# Patient Record
Sex: Female | Born: 1995 | Race: White | Hispanic: No | Marital: Married | Smoking: Never smoker
Health system: Southern US, Community
[De-identification: ages and names within clinical notes are randomized; demographics above are authoritative.]

## PROBLEM LIST (undated history)

## (undated) DIAGNOSIS — F419 Anxiety disorder, unspecified: Secondary | ICD-10-CM

## (undated) DIAGNOSIS — E119 Type 2 diabetes mellitus without complications: Secondary | ICD-10-CM

---

## 2015-02-17 ENCOUNTER — Emergency Department (HOSPITAL_COMMUNITY): Admission: EM | Admit: 2015-02-17 | Discharge: 2015-02-17 | Discharge status: Absent without leave | Payer: Self-pay

## 2015-02-21 ENCOUNTER — Other Ambulatory Visit (HOSPITAL_COMMUNITY)
Admission: RE | Admit: 2015-02-21 | Discharge: 2015-02-21 | Disposition: A | Discharge status: Home / Self Care | Payer: Medicaid Other | Source: Ambulatory Visit | Attending: Family Medicine | Admitting: Family Medicine

## 2015-02-21 ENCOUNTER — Emergency Department (INDEPENDENT_AMBULATORY_CARE_PROVIDER_SITE_OTHER)
Admission: EM | Admit: 2015-02-21 | Discharge: 2015-02-21 | Disposition: A | Discharge status: Home / Self Care | Payer: Medicaid Other | Source: Home / Self Care

## 2015-02-21 ENCOUNTER — Encounter (HOSPITAL_COMMUNITY): Payer: Self-pay | Admitting: Emergency Medicine

## 2015-02-21 DIAGNOSIS — Z113 Encounter for screening for infections with a predominantly sexual mode of transmission: Secondary | ICD-10-CM | POA: Diagnosis present

## 2015-02-21 DIAGNOSIS — A499 Bacterial infection, unspecified: Secondary | ICD-10-CM

## 2015-02-21 DIAGNOSIS — N760 Acute vaginitis: Secondary | ICD-10-CM | POA: Diagnosis not present

## 2015-02-21 DIAGNOSIS — B9689 Other specified bacterial agents as the cause of diseases classified elsewhere: Secondary | ICD-10-CM

## 2015-02-21 LAB — POCT I-STAT, CHEM 8
BUN: 9 mg/dL (ref 6–20)
CREATININE: 0.6 mg/dL (ref 0.44–1.00)
Calcium, Ion: 1.24 mmol/L — ABNORMAL HIGH (ref 1.12–1.23)
Chloride: 103 mmol/L (ref 101–111)
Glucose, Bld: 98 mg/dL (ref 65–99)
HCT: 44 % (ref 36.0–46.0)
Hemoglobin: 15 g/dL (ref 12.0–15.0)
POTASSIUM: 3.9 mmol/L (ref 3.5–5.1)
Sodium: 140 mmol/L (ref 135–145)
TCO2: 24 mmol/L (ref 0–100)

## 2015-02-21 LAB — POCT URINALYSIS DIP (DEVICE)
Bilirubin Urine: NEGATIVE
Glucose, UA: NEGATIVE mg/dL
Hgb urine dipstick: NEGATIVE
Ketones, ur: NEGATIVE mg/dL
Nitrite: NEGATIVE
PH: 6.5 (ref 5.0–8.0)
PROTEIN: NEGATIVE mg/dL
UROBILINOGEN UA: 0.2 mg/dL (ref 0.0–1.0)

## 2015-02-21 LAB — POCT PREGNANCY, URINE: Preg Test, Ur: NEGATIVE

## 2015-02-21 MED ORDER — METRONIDAZOLE 500 MG PO TABS: 500.0000 mg | ORAL_TABLET | Freq: Two times a day (BID) | ORAL | Status: DC

## 2015-02-21 NOTE — ED Notes (Signed)
C/o irregular bleeding for one day States she is anemic when she was prego 5 months ago States she has been dizzy and has headaches States her cycle was 10 days and next day she had irregular bleeding with blood clots

## 2015-02-21 NOTE — ED Provider Notes (Signed)
CSN: [id number]     Arrival date & time 02/21/15  1824 History   None    Chief Complaint  Patient presents with  . Metrorrhagia   (Consider location/radiation/quality/duration/timing/severity/associated sxs/prior Treatment)  HPI   The patient is an 19 year old female presenting tonight with complaints of menorrhagia. Patient is a G1/P1 with her last menstrual period 02/07/2015. She had an uncomplicated vaginal term delivery approximately 5 months ago.  The patient states that she had experienced menorrhagia a month or 2 postpartum and was anemic as a result. Patient is concerned because she states her last menses lasted approximately 10 days and she has been feeling rather tired and had a headache. Patient just finished her menstrual cycle, stopped breast-feeding exactly 6 weeks ago.    History reviewed. No pertinent past medical history. No past surgical history on file. History reviewed. No pertinent family history. History  Substance Use Topics  . Smoking status: Not on file  . Smokeless tobacco: Not on file  . Alcohol Use: Not on file   OB History    No data available     Review of Systems  Constitutional: Negative.  Negative for fever and chills.  HENT: Negative.  Negative for sinus pressure, sneezing and sore throat.   Eyes: Negative.   Respiratory: Negative for cough, shortness of breath, wheezing and stridor.   Cardiovascular: Negative.   Gastrointestinal: Negative.   Endocrine: Negative.   Genitourinary: Positive for menstrual problem. Negative for dysuria, frequency, vaginal discharge, vaginal pain and pelvic pain.  Skin: Negative.  Negative for pallor and rash.  Allergic/Immunologic: Negative for environmental allergies, food allergies and immunocompromised state.  Neurological: Negative.   Hematological: Negative.   Psychiatric/Behavioral: Negative.     Allergies  Review of patient's allergies indicates no known allergies.  Home Medications   Prior to  Admission medications   Medication Sig Start Date End Date Taking? Authorizing Provider  metroNIDAZOLE (FLAGYL) 500 MG tablet Take 1 tablet (500 mg total) by mouth 2 (two) times daily. 02/21/15   Servando Salina, NP   BP 105/77 mmHg  Pulse 74  Temp(Src) 98.1 F (36.7 C) (Oral)  Resp 16  SpO2 98%  LMP 02/07/2015   Physical Exam  Constitutional: She appears well-developed and well-nourished. No distress.  Neck: Normal range of motion. Neck supple. No thyromegaly present.  Cardiovascular: Normal rate, regular rhythm, normal heart sounds and intact distal pulses.  Exam reveals no gallop and no friction rub.   No murmur heard. Pulmonary/Chest: Effort normal and breath sounds normal. No respiratory distress. She has no wheezes. She has no rales. She exhibits no tenderness.  Abdominal: Soft. Bowel sounds are normal. She exhibits no distension and no mass. There is no tenderness. There is no rebound and no guarding. Hernia confirmed negative in the right inguinal area and confirmed negative in the left inguinal area.  Genitourinary: Uterus normal. No labial fusion. There is no rash, tenderness, lesion or injury on the right labia. There is no rash, tenderness, lesion or injury on the left labia. Uterus is not enlarged and not tender. Cervix exhibits discharge. Cervix exhibits no motion tenderness and no friability. Right adnexum displays no tenderness. Left adnexum displays no tenderness. No erythema, tenderness or bleeding in the vagina. No foreign body around the vagina. No signs of injury around the vagina. Vaginal discharge found.  Thin milky yellow-green discharge noted in vaginal vault. No evidence of menses from cervical os.  Lymphadenopathy:       Right: No inguinal adenopathy  present.       Left: No inguinal adenopathy present.  Skin: She is not diaphoretic.  Nursing note and vitals reviewed.   ED Course  Procedures (including critical care time) Labs Review Labs Reviewed  POCT  URINALYSIS DIP (DEVICE) - Abnormal; Notable for the following:    Leukocytes, UA SMALL (*)    All other components within normal limits  POCT I-STAT, CHEM 8 - Abnormal; Notable for the following:    Calcium, Ion 1.24 (*)    All other components within normal limits  POCT PREGNANCY, URINE  CERVICOVAGINAL ANCILLARY ONLY   Results for orders placed or performed during the hospital encounter of 02/21/15  POCT urinalysis dip (device)  Result Value Ref Range   Glucose, UA NEGATIVE NEGATIVE mg/dL   Bilirubin Urine NEGATIVE NEGATIVE   Ketones, ur NEGATIVE NEGATIVE mg/dL   Specific Gravity, Urine >=1.030 1.005 - 1.030   Hgb urine dipstick NEGATIVE NEGATIVE   pH 6.5 5.0 - 8.0   Protein, ur NEGATIVE NEGATIVE mg/dL   Urobilinogen, UA 0.2 0.0 - 1.0 mg/dL   Nitrite NEGATIVE NEGATIVE   Leukocytes, UA SMALL (A) NEGATIVE  Pregnancy, urine POC  Result Value Ref Range   Preg Test, Ur NEGATIVE NEGATIVE  I-STAT, chem 8  Result Value Ref Range   Sodium 140 135 - 145 mmol/L   Potassium 3.9 3.5 - 5.1 mmol/L   Chloride 103 101 - 111 mmol/L   BUN 9 6 - 20 mg/dL   Creatinine, Ser 9.14 0.44 - 1.00 mg/dL   Glucose, Bld 98 65 - 99 mg/dL   Calcium, Ion 7.82 (H) 1.12 - 1.23 mmol/L   TCO2 24 0 - 100 mmol/L   Hemoglobin 15.0 12.0 - 15.0 g/dL   HCT 95.6 21.3 - 08.6 %   GC/Chlamydia and affirm testing pending.  Imaging Review No results found.   MDM   1. BV (bacterial vaginosis)    Meds ordered this encounter  Medications  . metroNIDAZOLE (FLAGYL) 500 MG tablet    Sig: Take 1 tablet (500 mg total) by mouth 2 (two) times daily.    Dispense:  14 tablet    Refill:  0   No evidence of anemia. Patient likely has bacterial vaginosis. Discussed use of probiotic or yogurt daily to foster good vaginal health. The patient verbalizes understanding and agrees to plan of care.       Servando Salina, NP 02/21/15 2007

## 2015-02-21 NOTE — Discharge Instructions (Signed)
Bacterial Vaginosis Bacterial vaginosis is a vaginal infection that occurs when the normal balance of bacteria in the vagina is disrupted. It results from an overgrowth of certain bacteria. This is the most common vaginal infection in women of childbearing age. Treatment is important to prevent complications, especially in pregnant women, as it can cause a premature delivery. CAUSES  Bacterial vaginosis is caused by an increase in harmful bacteria that are normally present in smaller amounts in the vagina. Several different kinds of bacteria can cause bacterial vaginosis. However, the reason that the condition develops is not fully understood. RISK FACTORS Certain activities or behaviors can put you at an increased risk of developing bacterial vaginosis, including:  Having a new sex partner or multiple sex partners.  Douching.  Using an intrauterine device (IUD) for contraception. Women do not get bacterial vaginosis from toilet seats, bedding, swimming pools, or contact with objects around them. SIGNS AND SYMPTOMS  Some women with bacterial vaginosis have no signs or symptoms. Common symptoms include:  Grey vaginal discharge.  A fishlike odor with discharge, especially after sexual intercourse.  Itching or burning of the vagina and vulva.  Burning or pain with urination. DIAGNOSIS  Your health care provider will take a medical history and examine the vagina for signs of bacterial vaginosis. A sample of vaginal fluid may be taken. Your health care provider will look at this sample under a microscope to check for bacteria and abnormal cells. A vaginal pH test may also be done.  TREATMENT  Bacterial vaginosis may be treated with antibiotic medicines. These may be given in the form of a pill or a vaginal cream. A second round of antibiotics may be prescribed if the condition comes back after treatment.  HOME CARE INSTRUCTIONS   Only take over-the-counter or prescription medicines as  directed by your health care provider.  If antibiotic medicine was prescribed, take it as directed. Make sure you finish it even if you start to feel better.  Do not have sex until treatment is completed.  Tell all sexual partners that you have a vaginal infection. They should see their health care provider and be treated if they have problems, such as a mild rash or itching.  Practice safe sex by using condoms and only having one sex partner. SEEK MEDICAL CARE IF:   Your symptoms are not improving after 3 days of treatment.  You have increased discharge or pain.  You have a fever. MAKE SURE YOU:   Understand these instructions.  Will watch your condition.  Will get help right away if you are not doing well or get worse. FOR MORE INFORMATION  Centers for Disease Control and Prevention, Division of STD Prevention: www.cdc.gov/std American Sexual Health Association (ASHA): www.ashastd.org  Document Released: 07/13/2005 Document Revised: 05/03/2013 Document Reviewed: 02/22/2013 Exit[employer] Patient Information 2015 [employer]. This information is not intended to replace advice given to you by your health care provider. Make sure you discuss any questions you have with your health care provider.  

## 2015-02-22 LAB — CERVICOVAGINAL ANCILLARY ONLY
CHLAMYDIA, DNA PROBE: NEGATIVE
Neisseria Gonorrhea: NEGATIVE
Wet Prep (BD Affirm): NEGATIVE

## 2015-02-22 NOTE — ED Notes (Signed)
Final report of GYN testing negative for GC, chlamydia, trich, yeast, gardnerella

## 2015-10-06 ENCOUNTER — Emergency Department (INDEPENDENT_AMBULATORY_CARE_PROVIDER_SITE_OTHER)
Admission: EM | Admit: 2015-10-06 | Discharge: 2015-10-06 | Disposition: A | Discharge status: Home / Self Care | Payer: Medicaid Other | Source: Home / Self Care | Attending: Emergency Medicine | Admitting: Emergency Medicine

## 2015-10-06 ENCOUNTER — Emergency Department (INDEPENDENT_AMBULATORY_CARE_PROVIDER_SITE_OTHER): Payer: Medicaid Other

## 2015-10-06 ENCOUNTER — Other Ambulatory Visit (HOSPITAL_COMMUNITY)
Admission: RE | Admit: 2015-10-06 | Discharge: 2015-10-06 | Disposition: A | Discharge status: Home / Self Care | Payer: Medicaid Other | Source: Ambulatory Visit | Attending: Emergency Medicine | Admitting: Emergency Medicine

## 2015-10-06 ENCOUNTER — Encounter (HOSPITAL_COMMUNITY): Payer: Self-pay | Admitting: Emergency Medicine

## 2015-10-06 DIAGNOSIS — N3090 Cystitis, unspecified without hematuria: Secondary | ICD-10-CM | POA: Diagnosis not present

## 2015-10-06 DIAGNOSIS — K5901 Slow transit constipation: Secondary | ICD-10-CM | POA: Diagnosis not present

## 2015-10-06 LAB — POCT URINALYSIS DIP (DEVICE)
GLUCOSE, UA: NEGATIVE mg/dL
Ketones, ur: NEGATIVE mg/dL
Nitrite: NEGATIVE
PH: 6 (ref 5.0–8.0)
PROTEIN: 30 mg/dL — AB
SPECIFIC GRAVITY, URINE: 1.025 (ref 1.005–1.030)
UROBILINOGEN UA: 1 mg/dL (ref 0.0–1.0)

## 2015-10-06 MED ORDER — CEPHALEXIN 500 MG PO CAPS: 500.0000 mg | ORAL_CAPSULE | Freq: Four times a day (QID) | ORAL | Status: DC

## 2015-10-06 NOTE — ED Provider Notes (Signed)
CSN: [id number]     Arrival date & time 10/06/15  1312 History   First MD Initiated Contact with Patient 10/06/15 1431     Chief Complaint  Patient presents with  . Abdominal Pain   (Consider location/radiation/quality/duration/timing/severity/associated sxs/prior Treatment) HPI Comments: 20 year old female complaining of abdominal and pelvic pain. Pain started gradually and mildly 3 days ago. It became worse yesterday and has little better today. She describes the area of abdominal pain starting in the epigastrium and encompassing the entire abdomen. She describes pelvic pain as directly over the suprapubic area. Denies chest pain or shortness of breath. Denies vaginal discharge or bleeding. She is 9 days status post elective AB. Denies dysuria but states her suprapubic pain is exacerbated upon voiding and with a full bladder. Denies fever, chills, nausea, vomiting, diarrhea. She states that she has normal daily bowel movements.   History reviewed. No pertinent past medical history. History reviewed. No pertinent past surgical history. No family history on file. Social History  Substance Use Topics  . Smoking status: None  . Smokeless tobacco: None  . Alcohol Use: None   OB History    No data available     Review of Systems  Constitutional: Negative for fever, chills, activity change and fatigue.  HENT: Negative.   Respiratory: Negative.   Cardiovascular: Negative for chest pain.  Gastrointestinal: Positive for abdominal pain. Negative for nausea, vomiting, diarrhea, constipation, blood in stool, anal bleeding and rectal pain.  Genitourinary: Positive for urgency and pelvic pain. Negative for frequency, hematuria, flank pain, vaginal discharge, difficulty urinating, genital sores and vaginal pain.       See history of present illness.  Musculoskeletal: Negative for myalgias and back pain.  Skin: Negative.   Neurological: Negative.   Hematological: Negative.    Psychiatric/Behavioral: Negative.   All other systems reviewed and are negative.   Allergies  Review of patient's allergies indicates no known allergies.  Home Medications   Prior to Admission medications   Medication Sig Start Date End Date Taking? Authorizing Provider  cephALEXin (KEFLEX) 500 MG capsule Take 1 capsule (500 mg total) by mouth 4 (four) times daily. 10/06/15   Hayden Rasmussen, NP  metroNIDAZOLE (FLAGYL) 500 MG tablet Take 1 tablet (500 mg total) by mouth 2 (two) times daily. 02/21/15   Servando Salina, NP   Meds Ordered and Administered this Visit  Medications - No data to display  BP 116/76 mmHg  Pulse 97  Temp(Src) 98.2 F (36.8 C) (Oral)  Resp 18  SpO2 99%  LMP 09/29/2015 No data found.   Physical Exam  Constitutional: She is oriented to person, place, and time. She appears well-developed and well-nourished. No distress.  HENT:  Head: Normocephalic and atraumatic.  Eyes: EOM are normal.  Neck: Normal range of motion. Neck supple.  Cardiovascular: Normal rate, regular rhythm and normal heart sounds.   Pulmonary/Chest: Effort normal and breath sounds normal. No respiratory distress.  Abdominal: Soft. She exhibits no mass. There is no rebound and no guarding.  Bowel sounds hypoactive. Percussion of the abdomen is dull in all 4 quadrants. Mild tenderness to the left lower quadrant. Mild to moderate tenderness to the right hemiabdomen.  Tenderness across the suprapubis.  Musculoskeletal: Normal range of motion. She exhibits no edema.  Neurological: She is alert and oriented to person, place, and time. She exhibits normal muscle tone. Coordination normal.  Skin: Skin is warm and dry.  Psychiatric: She has a normal mood and affect. Her behavior is normal.  Nursing note and vitals reviewed.   ED Course  Procedures (including critical care time)  Labs Review Labs Reviewed  POCT URINALYSIS DIP (DEVICE) - Abnormal; Notable for the following:    Bilirubin  Urine SMALL (*)    Hgb urine dipstick TRACE (*)    Protein, ur 30 (*)    Leukocytes, UA SMALL (*)    All other components within normal limits  URINE CULTURE    Imaging Review Dg Abd 1 View  10/06/2015  CLINICAL DATA:  20 year old female with abdominal pain for the past 3 days. EXAM: ABDOMEN - 1 VIEW COMPARISON:  No priors. FINDINGS: Gas and stool are seen scattered throughout the colon extending to the level of the distal rectum. No pathologic distension of small bowel is noted. No gross evidence of pneumoperitoneum. IMPRESSION: 1. Nonobstructive bowel gas pattern. 2. No pneumoperitoneum. Electronically Signed   By: Trudie Reedaniel  Entrikin M.D.   On: 10/06/2015 15:29     Visual Acuity Review  Right Eye Distance:   Left Eye Distance:   Bilateral Distance:    Right Eye Near:   Left Eye Near:    Bilateral Near:         MDM   1. Slow transit constipation   2. Cystitis     Start taking the Keflex antibiotic as directed. Increase your fluids. If your pelvic or bladder pain gets worse, or if you develop a vaginal discharge or bleeding you should [facility] promptly. Miralax, fiber and increase fluids    Hayden Rasmussenavid Ohn Bostic, NP 10/06/15 1558

## 2015-10-06 NOTE — ED Notes (Signed)
C/o constant abd pain onset x1 week associated w/bloating [place] Denies fevers, urinary sx, vag d/c A&O x4... No acute distress.

## 2015-10-06 NOTE — Discharge Instructions (Signed)
Constipation, Adult MiraLAX for constipation. Drink plenty of fluids and increase fiber in your diet. Constipation is when a person has fewer than three bowel movements a week, has difficulty having a bowel movement, or has stools that are dry, hard, or larger than normal. As people grow older, constipation is more common. A low-fiber diet, not taking in enough fluids, and taking certain medicines may make constipation worse.  CAUSES   Certain medicines, such as antidepressants, pain medicine, iron supplements, antacids, and water pills.   Certain diseases, such as diabetes, irritable bowel syndrome (IBS), thyroid disease, or depression.   Not drinking enough water.   Not eating enough fiber-rich foods.   Stress or travel.   Lack of physical activity or exercise.   Ignoring the urge to have a bowel movement.   Using laxatives too much.  SIGNS AND SYMPTOMS   Having fewer than three bowel movements a week.   Straining to have a bowel movement.   Having stools that are hard, dry, or larger than normal.   Feeling full or bloated.   Pain in the lower abdomen.   Not feeling relief after having a bowel movement.  DIAGNOSIS  Your health care provider will take a medical history and perform a physical exam. Further testing may be done for severe constipation. Some tests may include:  A barium enema X-ray to examine your rectum, colon, and, sometimes, your small intestine.   A sigmoidoscopy to examine your lower colon.   A colonoscopy to examine your entire colon. TREATMENT  Treatment will depend on the severity of your constipation and what is causing it. Some dietary treatments include drinking more fluids and eating more fiber-rich foods. Lifestyle treatments may include regular exercise. If these diet and lifestyle recommendations do not help, your health care provider may recommend taking over-the-counter laxative medicines to help you have bowel movements.  Prescription medicines may be prescribed if over-the-counter medicines do not work.  HOME CARE INSTRUCTIONS   Eat foods that have a lot of fiber, such as fruits, vegetables, whole grains, and beans.  Limit foods high in fat and processed sugars, such as french fries, hamburgers, cookies, candies, and soda.   A fiber supplement may be added to your diet if you cannot get enough fiber from foods.   Drink enough fluids to keep your urine clear or pale yellow.   Exercise regularly or as directed by your health care provider.   Go to the restroom when you have the urge to go. Do not hold it.   Only take over-the-counter or prescription medicines as directed by your health care provider. Do not take other medicines for constipation without talking to your health care provider first.  SEEK IMMEDIATE MEDICAL CARE IF:   You have bright red blood in your stool.   Your constipation lasts for more than 4 days or gets worse.   You have abdominal or rectal pain.   You have thin, pencil-like stools.   You have unexplained weight loss. MAKE SURE YOU:   Understand these instructions.  Will watch your condition.  Will get help right away if you are not doing well or get worse.   This information is not intended to replace advice given to you by your health care provider. Make sure you discuss any questions you have with your health care provider.   Document Released: 04/10/2004 Document Revised: 08/03/2014 Document Reviewed: 04/24/2013 Elsevier Interactive Patient Education 2016 Elsevier Inc.  High-Fiber Diet Fiber, also called dietary fiber,  is a type of carbohydrate found in fruits, vegetables, whole grains, and beans. A high-fiber diet can have many health benefits. Your health care provider may recommend a high-fiber diet to help:  Prevent constipation. Fiber can make your bowel movements more regular.  Lower your cholesterol.  Relieve hemorrhoids, uncomplicated  diverticulosis, or irritable bowel syndrome.  Prevent overeating as part of a weight-loss plan.  Prevent heart disease, type 2 diabetes, and certain cancers. WHAT IS MY PLAN? The recommended daily intake of fiber includes:  38 grams for men under age 76.  30 grams for men over age 3.  25 grams for women under age 29.  21 grams for women over age 17. You can get the recommended daily intake of dietary fiber by eating a variety of fruits, vegetables, grains, and beans. Your health care provider may also recommend a fiber supplement if it is not possible to get enough fiber through your diet. WHAT DO I NEED TO KNOW ABOUT A HIGH-FIBER DIET?  Fiber supplements have not been widely studied for their effectiveness, so it is better to get fiber through food sources.  Always check the fiber content on thenutrition facts label of any prepackaged food. Look for foods that contain at least 5 grams of fiber per serving.  Ask your dietitian if you have questions about specific foods that are related to your condition, especially if those foods are not listed in the following section.  Increase your daily fiber consumption gradually. Increasing your intake of dietary fiber too quickly may cause bloating, cramping, or gas.  Drink plenty of water. Water helps you to digest fiber. WHAT FOODS CAN I EAT? Grains Whole-grain breads. Multigrain cereal. Oats and oatmeal. Brown rice. Barley. Bulgur wheat. Millet. Bran muffins. Popcorn. Rye wafer crackers. Vegetables Sweet potatoes. Spinach. Kale. Artichokes. Cabbage. Broccoli. Green peas. Carrots. Squash. Fruits Berries. Pears. Apples. Oranges. Avocados. Prunes and raisins. Dried figs. Meats and Other Protein Sources Navy, kidney, pinto, and soy beans. Split peas. Lentils. Nuts and seeds. Dairy Fiber-fortified yogurt. Beverages Fiber-fortified soy milk. Fiber-fortified orange juice. Other Fiber bars. The items listed above may not be a complete  list of recommended foods or beverages. Contact your dietitian for more options. WHAT FOODS ARE NOT RECOMMENDED? Grains White bread. Pasta made with refined flour. White rice. Vegetables Fried potatoes. Canned vegetables. Well-cooked vegetables.  Fruits Fruit juice. Cooked, strained fruit. Meats and Other Protein Sources Fatty cuts of meat. Fried Environmental education officer or fried fish. Dairy Milk. Yogurt. Cream cheese. Sour cream. Beverages Soft drinks. Other Cakes and pastries. Butter and oils. The items listed above may not be a complete list of foods and beverages to avoid. Contact your dietitian for more information. WHAT ARE SOME TIPS FOR INCLUDING HIGH-FIBER FOODS IN MY DIET?  Eat a wide variety of high-fiber foods.  Make sure that half of all grains consumed each day are whole grains.  Replace breads and cereals made from refined flour or white flour with whole-grain breads and cereals.  Replace white rice with brown rice, bulgur wheat, or millet.  Start the day with a breakfast that is high in fiber, such as a cereal that contains at least 5 grams of fiber per serving.  Use beans in place of meat in soups, salads, or pasta.  Eat high-fiber snacks, such as berries, raw vegetables, nuts, or popcorn.   This information is not intended to replace advice given to you by your health care provider. Make sure you discuss any questions you have with your  health care provider.   Document Released: 07/13/2005 Document Revised: 08/03/2014 Document Reviewed: 12/26/2013 Elsevier Interactive Patient Education 2016 Elsevier Inc.  Urinary Tract Infection Start taking the Keflex antibiotic as directed. Increase your fluids. If your pelvic or bladder pain gets worse, or if you develop a vaginal discharge or bleeding you should [facility] directly to [facility] promptly. Urinary tract infections (UTIs) can develop anywhere along your urinary tract. Your urinary tract is your body's drainage system for  removing wastes and extra water. Your urinary tract includes two kidneys, two ureters, a bladder, and a urethra. Your kidneys are a pair of bean-shaped organs. Each kidney is about the size of your fist. They are located below your ribs, one on each side of your spine. CAUSES Infections are caused by microbes, which are microscopic organisms, including fungi, viruses, and bacteria. These organisms are so small that they can only be seen through a microscope. Bacteria are the microbes that most commonly cause UTIs. SYMPTOMS  Symptoms of UTIs may vary by age and gender of the patient and by the location of the infection. Symptoms in young women typically include a frequent and intense urge to urinate and a painful, burning feeling in the bladder or urethra during urination. Older women and men are more likely to be tired, shaky, and weak and have muscle aches and abdominal pain. A fever may mean the infection is in your kidneys. Other symptoms of a kidney infection include pain in your back or sides below the ribs, nausea, and vomiting. DIAGNOSIS To diagnose a UTI, your caregiver will ask you about your symptoms. Your caregiver will also ask you to provide a urine sample. The urine sample will be tested for bacteria and white blood cells. White blood cells are made by your body to help fight infection. TREATMENT  Typically, UTIs can be treated with medication. Because most UTIs are caused by a bacterial infection, they usually can be treated with the use of antibiotics. The choice of antibiotic and length of treatment depend on your symptoms and the type of bacteria causing your infection. HOME CARE INSTRUCTIONS  If you were prescribed antibiotics, take them exactly as your caregiver instructs you. Finish the medication even if you feel better after you have only taken some of the medication.  Drink enough water and fluids to keep your urine clear or pale yellow.  Avoid caffeine, tea, and carbonated  beverages. They tend to irritate your bladder.  Empty your bladder often. Avoid holding urine for long periods of time.  Empty your bladder before and after sexual intercourse.  After a bowel movement, women should cleanse from front to back. Use each tissue only once. SEEK MEDICAL CARE IF:   You have back pain.  You develop a fever.  Your symptoms do not begin to resolve within 3 days. SEEK IMMEDIATE MEDICAL CARE IF:   You have severe back pain or lower abdominal pain.  You develop chills.  You have nausea or vomiting.  You have continued burning or discomfort with urination. MAKE SURE YOU:   Understand these instructions.  Will watch your condition.  Will get help right away if you are not doing well or get worse.   This information is not intended to replace advice given to you by your health care provider. Make sure you discuss any questions you have with your health care provider.   Document Released: 04/22/2005 Document Revised: 04/03/2015 Document Reviewed: 08/21/2011 Elsevier Interactive Patient Education [employer] Elsevier Inc.

## 2015-10-08 LAB — URINE CULTURE: Special Requests: NORMAL

## 2015-10-15 ENCOUNTER — Telehealth (HOSPITAL_COMMUNITY): Payer: Self-pay | Admitting: Emergency Medicine

## 2015-10-15 NOTE — ED Notes (Signed)
x1 attempt  LM on pt's VM [phone] Need to give lab results from recent visit on 3/12  Per Dr. Dayton ScrapeMurray,  Please let patient know that urine culture does not clearly demonstrate UTI. Can discontinue rx for cephalexin [facility]. Recheck for persistent sx's. LM  Will try later.

## 2016-07-09 ENCOUNTER — Emergency Department (HOSPITAL_COMMUNITY)
Admission: EM | Admit: 2016-07-09 | Discharge: 2016-07-09 | Disposition: A | Discharge status: Home / Self Care | Payer: Medicaid Other | Attending: Emergency Medicine | Admitting: Emergency Medicine

## 2016-07-09 ENCOUNTER — Encounter (HOSPITAL_COMMUNITY): Payer: Self-pay | Admitting: Emergency Medicine

## 2016-07-09 DIAGNOSIS — L509 Urticaria, unspecified: Secondary | ICD-10-CM | POA: Diagnosis present

## 2016-07-09 MED ORDER — METHYLPREDNISOLONE 4 MG PO TBPK: ORAL_TABLET | ORAL | 0 refills | Status: DC

## 2016-07-09 NOTE — ED Notes (Signed)
ED Provider at bedside. 

## 2016-07-09 NOTE — ED Triage Notes (Signed)
Pt states every night about this time for the past week she breaks out in hives   Pt states she has used benedryl, gold bond, and red oil without relief  Pt states they itch and burn

## 2016-07-09 NOTE — ED Provider Notes (Signed)
[facility]-EMERGENCY DEPT Provider Note   CSN: [id number] Arrival date & time: 07/09/16  2126  By signing my name below, I, Sonum Patel, attest that this documentation has been prepared under the direction and in the presence of Raeford RazorStephen Eliazer Hemphill, MD. Electronically Signed: Sonum Patel, Neurosurgeoncribe. 07/09/16. 10:46 PM.  History   Chief Complaint Chief Complaint  Patient presents with  . Urticaria    The history is provided by the patient. No language interpreter was used.     HPI Comments: Jackie Sweeney is a 20 y.o. female who presents to the Emergency Department complaining of a persistent pruritic rash to the bilateral thighs, torso, and head that occurs nightly for the past 1 week. She also complains of a HA and lightheadedness. She denies new soaps, detergents, or change in body products or nightly routine. She denies new sheets. She denies similar symptoms in the past. She has tried Benadryl with some relief. She denies nausea, abdominal pain, diarrhea, SOB.     History reviewed. No pertinent past medical history.  There are no active problems to display for this patient.   History reviewed. No pertinent surgical history.  OB History    No data available       Home Medications    Prior to Admission medications   Not on File    Family History History reviewed. No pertinent family history.  Social History Social History  Substance Use Topics  . Smoking status: Never Smoker  . Smokeless tobacco: Never Used  . Alcohol use No     Allergies   Patient has no known allergies.   Review of Systems Review of Systems  Respiratory: Negative for shortness of breath.   Gastrointestinal: Negative for abdominal pain, diarrhea and nausea.  Skin: Positive for rash.  Neurological: Positive for light-headedness and headaches.     Physical Exam Updated Vital Signs BP 133/68 (BP Location: Right Arm)   Pulse 111   Temp 98.6 F (37 C) (Oral)   Resp 18   LMP 06/25/2016  (Approximate)   SpO2 100%   Physical Exam  Constitutional: She is oriented to person, place, and time. She appears well-developed and well-nourished. No distress.  HENT:  Head: Normocephalic.  Eyes: EOM are normal.  Neck: Normal range of motion.  Pulmonary/Chest: Effort normal and breath sounds normal.  Breath sounds clear. Speaks in complete sentences.   Abdominal: She exhibits no distension.  Musculoskeletal: Normal range of motion.  Neurological: She is alert and oriented to person, place, and time.  Skin: Rash noted.  Splotchy, erythematous rash on upper chest and back.   Psychiatric: She has a normal mood and affect.  Nursing note and vitals reviewed.    ED Treatments / Results  DIAGNOSTIC STUDIES: Oxygen Saturation is 100% on RA, normal by my interpretation.    COORDINATION OF CARE: 10:46 PM Discussed treatment plan with pt at bedside and pt agreed to plan.   Labs (all labs ordered are listed, but only abnormal results are displayed) Labs Reviewed - No data to display  EKG  EKG Interpretation None       Radiology No results found.  Procedures Procedures (including critical care time)  Medications Ordered in ED Medications - No data to display   Initial Impression / Assessment and Plan / ED Course  I have reviewed the triage vital signs and the nursing notes.  Pertinent labs & imaging results that were available during my care of the patient were reviewed by me and considered in my  medical decision making (see chart for details).  Clinical Course     20 year old female with hives of unclear precipitant. Hemodynamically stable. No GI complaints. No respiratory complaints. Not consistent with anaphylaxis. No new exposures that she is aware of. Discussed continuing as needed antihistamines and will also give a course of steroids. Return precautions were discussed.  Final Clinical Impressions(s) / ED Diagnoses   Final diagnoses:  Urticaria    New  Prescriptions New Prescriptions   No medications on file    I personally preformed the services scribed in my presence. The recorded information has been reviewed is accurate. Raeford RazorStephen Loletta Harper, MD.    Raeford RazorStephen Raidon Swanner, MD 07/15/16 1005

## 2016-07-10 ENCOUNTER — Encounter (HOSPITAL_COMMUNITY): Payer: Self-pay | Admitting: Emergency Medicine

## 2017-02-02 ENCOUNTER — Encounter (HOSPITAL_COMMUNITY): Payer: Self-pay | Admitting: Vascular Surgery

## 2017-02-02 ENCOUNTER — Emergency Department (HOSPITAL_COMMUNITY)
Admission: EM | Admit: 2017-02-02 | Discharge: 2017-02-02 | Disposition: A | Discharge status: Home / Self Care | Payer: Medicaid Other | Attending: Emergency Medicine | Admitting: Emergency Medicine

## 2017-02-02 DIAGNOSIS — N3001 Acute cystitis with hematuria: Secondary | ICD-10-CM | POA: Insufficient documentation

## 2017-02-02 DIAGNOSIS — B9689 Other specified bacterial agents as the cause of diseases classified elsewhere: Secondary | ICD-10-CM | POA: Insufficient documentation

## 2017-02-02 DIAGNOSIS — R1030 Lower abdominal pain, unspecified: Secondary | ICD-10-CM | POA: Diagnosis not present

## 2017-02-02 DIAGNOSIS — Z3A09 9 weeks gestation of pregnancy: Secondary | ICD-10-CM | POA: Diagnosis not present

## 2017-02-02 DIAGNOSIS — N760 Acute vaginitis: Secondary | ICD-10-CM | POA: Diagnosis not present

## 2017-02-02 DIAGNOSIS — O209 Hemorrhage in early pregnancy, unspecified: Secondary | ICD-10-CM | POA: Diagnosis present

## 2017-02-02 LAB — WET PREP, GENITAL
SPERM: NONE SEEN
Trich, Wet Prep: NONE SEEN
YEAST WET PREP: NONE SEEN

## 2017-02-02 LAB — URINALYSIS, ROUTINE W REFLEX MICROSCOPIC
BILIRUBIN URINE: NEGATIVE
Glucose, UA: NEGATIVE mg/dL
KETONES UR: 20 mg/dL — AB
Nitrite: POSITIVE — AB
PROTEIN: 100 mg/dL — AB
Specific Gravity, Urine: 1.021 (ref 1.005–1.030)
pH: 5 (ref 5.0–8.0)

## 2017-02-02 LAB — COMPREHENSIVE METABOLIC PANEL
ALT: 29 U/L (ref 14–54)
ANION GAP: 7 (ref 5–15)
AST: 17 U/L (ref 15–41)
Albumin: 3.8 g/dL (ref 3.5–5.0)
Alkaline Phosphatase: 53 U/L (ref 38–126)
BUN: 7 mg/dL (ref 6–20)
CHLORIDE: 108 mmol/L (ref 101–111)
CO2: 19 mmol/L — ABNORMAL LOW (ref 22–32)
Calcium: 9.1 mg/dL (ref 8.9–10.3)
Creatinine, Ser: 0.56 mg/dL (ref 0.44–1.00)
GFR calc non Af Amer: >60 mL/min (ref >60)
GLUCOSE: 89 mg/dL (ref 65–99)
POTASSIUM: 4 mmol/L (ref 3.5–5.1)
Sodium: 134 mmol/L — ABNORMAL LOW (ref 135–145)
Total Bilirubin: 0.8 mg/dL (ref 0.3–1.2)
Total Protein: 7.2 g/dL (ref 6.5–8.1)

## 2017-02-02 LAB — CBC
HEMATOCRIT: 39.3 % (ref 36.0–46.0)
HEMOGLOBIN: 12.7 g/dL (ref 12.0–15.0)
MCH: 26.9 pg (ref 26.0–34.0)
MCHC: 32.3 g/dL (ref 30.0–36.0)
MCV: 83.3 fL (ref 78.0–100.0)
Platelets: 239 10*3/uL (ref 150–400)
RBC: 4.72 MIL/uL (ref 3.87–5.11)
RDW: 14.9 % (ref 11.5–15.5)
WBC: 8.9 10*3/uL (ref 4.0–10.5)

## 2017-02-02 LAB — HCG, QUANTITATIVE, PREGNANCY: hCG, Beta Chain, Quant, S: 42079 m[IU]/mL — ABNORMAL HIGH (ref <5)

## 2017-02-02 MED ORDER — CEPHALEXIN 250 MG PO CAPS
1000.0000 mg | ORAL_CAPSULE | Freq: Once | ORAL | Status: AC
  Administered 2017-02-02: 1000 mg via ORAL
  Filled 2017-02-02: qty 4

## 2017-02-02 MED ORDER — CEPHALEXIN 500 MG PO CAPS: ORAL_CAPSULE | ORAL | 0 refills | Status: DC

## 2017-02-02 MED ORDER — METRONIDAZOLE 500 MG PO TABS: 500.0000 mg | ORAL_TABLET | Freq: Two times a day (BID) | ORAL | 0 refills | Status: DC

## 2017-02-02 NOTE — ED Triage Notes (Signed)
Pt reports to the ED for eval of low abd pain x 2 days. She states that she is [redacted] weeks pregnant. Pt reports today she developed some vaginal bleeding as well. Called her OB and they told her to come here. Last OB appt was approx 1 month ago.

## 2017-02-02 NOTE — Discharge Instructions (Signed)
Please seek immediate care if you develop the following: ?There is back pain.  ?Your symptoms are no better or worse in 3 days. ?There is severe back pain or lower abdominal pain.  ?You develop chills.  ?You have a fever.  ?There is nausea or vomiting.  ?There is continued burning or discomfort with urination.  ? ?

## 2017-02-02 NOTE — ED Provider Notes (Signed)
[facility]-EMERGENCY DEPT Provider Note   CSN: [id number] Arrival date & time: 02/02/17  1158     History   Chief Complaint Chief Complaint  Patient presents with  . Vaginal Bleeding  . Abdominal Pain    HPI Jackie Sweeney is a G2P1 21 y.o. female   [redacted] weeks pregnant . Began lower abdominal cramping 4 days ago. Followed by vaginal bleeding this morning.Described as constant flow but no having to use a pad. Only comes out significantly when urinating.  No trauma or heavy lifting. She had intercourse last night and feels tis may have precipitated the bleeding. No meds or prenatal vitamins at this time.  No hx of easy bleeding.  No Fevers or flank pain. She has had nausea, vomiting, and fatigue associated with her pregnancy which are mild.  HPI  History reviewed. No pertinent past medical history.  There are no active problems to display for this patient.   History reviewed. No pertinent surgical history.  OB History    Gravida Para Term Preterm AB Living   1             SAB TAB Ectopic Multiple Live Births                   Home Medications    Prior to Admission medications   Medication Sig Start Date End Date Taking? Authorizing Provider  cephALEXin (KEFLEX) 500 MG capsule 2 caps po bid x 7 days 02/02/17   Arthor CaptainHarris, Aunica Dauphinee, PA-C  methylPREDNISolone (MEDROL DOSEPAK) 4 MG TBPK tablet 6 tablets day 1, then 5 day 2, then 4, 3, 2, 1. 07/09/16   Raeford RazorKohut, Stephen, MD  metroNIDAZOLE (FLAGYL) 500 MG tablet Take 1 tablet (500 mg total) by mouth 2 (two) times daily. One po bid x 7 days 02/02/17   Arthor CaptainHarris, Marqueze Ramcharan, PA-C    Family History No family history on file.  Social History Social History  Substance Use Topics  . Smoking status: Never Smoker  . Smokeless tobacco: Never Used  . Alcohol use No     Allergies   Patient has no known allergies.   Review of Systems Review of Systems Ten systems reviewed and are negative for acute change, except as noted in the HPI.     Physical Exam Updated Vital Signs BP 109/62 (BP Location: Right Arm)   Pulse 74   Temp 98.4 F (36.9 C) (Oral)   Resp 12   Ht 5\' 3"  (1.6 m)   Wt 86.2 kg (190 lb)   LMP 06/25/2016 (Approximate)   SpO2 99%   BMI 33.66 kg/m   Physical Exam  Constitutional: She is oriented to person, place, and time. She appears well-developed and well-nourished. No distress.  HENT:  Head: Normocephalic and atraumatic.  Eyes: Conjunctivae are normal. No scleral icterus.  Neck: Normal range of motion.  Cardiovascular: Normal rate, regular rhythm and normal heart sounds.  Exam reveals no gallop and no friction rub.   No murmur heard. Pulmonary/Chest: Effort normal and breath sounds normal. No respiratory distress.  Abdominal: Soft. Bowel sounds are normal. She exhibits no distension and no mass. There is no tenderness. There is no guarding.  Genitourinary:  Genitourinary Comments: Pelvic exam: normal external genitalia, vulva, vagina, cervix, uterus and adnexa.   Neurological: She is alert and oriented to person, place, and time.  Skin: Skin is warm and dry. She is not diaphoretic.  Psychiatric: Her behavior is normal.  Nursing note and vitals reviewed.    ED Treatments /  Results  Labs (all labs ordered are listed, but only abnormal results are displayed) Labs Reviewed  WET PREP, GENITAL - Abnormal; Notable for the following:       Result Value   Clue Cells Wet Prep HPF POC PRESENT (*)    WBC, Wet Prep HPF POC MODERATE (*)    All other components within normal limits  COMPREHENSIVE METABOLIC PANEL - Abnormal; Notable for the following:    Sodium 134 (*)    CO2 19 (*)    All other components within normal limits  URINALYSIS, ROUTINE W REFLEX MICROSCOPIC - Abnormal; Notable for the following:    APPearance CLOUDY (*)    Hgb urine dipstick LARGE (*)    Ketones, ur 20 (*)    Protein, ur 100 (*)    Nitrite POSITIVE (*)    Leukocytes, UA LARGE (*)    Bacteria, UA MANY (*)    Squamous  Epithelial / LPF 0-5 (*)    All other components within normal limits  HCG, QUANTITATIVE, PREGNANCY - Abnormal; Notable for the following:    hCG, Beta Chain, Quant, S 42,079 (*)    All other components within normal limits  URINE CULTURE  CBC  GC/CHLAMYDIA PROBE AMP ([place]) NOT AT [facility]    EKG  EKG Interpretation None       Radiology No results found.  Procedures Procedures (including critical care time)  Medications Ordered in ED Medications  cephALEXin (KEFLEX) capsule 1,000 mg (1,000 mg Oral Given 02/02/17 1644)     Initial Impression / Assessment and Plan / ED Course  I have reviewed the triage vital signs and the nursing notes.  Pertinent labs & imaging results that were available during my care of the patient were reviewed by me and considered in my medical decision making (see chart for details).  Clinical Course as of Feb 02 1802  Tue Feb 02, 2017  1637 Patient appears to have a UTI. Culture sent.  [AH]    Clinical Course User Index [AH] Arthor Captain, PA-C   Patient with urinary tract infection, no signs of high low. She is not considering pursuing the rest of her pregnancy at this time, although I have offered the patient follow-up with the [facility]'s outpatient clinic at [facility]. Patient will be treated with Keflex and Flagyl. She appears safe for discharge at this time.  Final Clinical Impressions(s) / ED Diagnoses   Final diagnoses:  Acute cystitis with hematuria  BV (bacterial vaginosis)    New Prescriptions New Prescriptions   CEPHALEXIN (KEFLEX) 500 MG CAPSULE    2 caps po bid x 7 days   METRONIDAZOLE (FLAGYL) 500 MG TABLET    Take 1 tablet (500 mg total) by mouth 2 (two) times daily. One po bid x 7 days     Arthor Captain, PA-C 02/02/17 1803    Alvira Monday, MD 02/03/17 1304

## 2017-02-04 LAB — GC/CHLAMYDIA PROBE AMP (~~LOC~~) NOT AT ARMC
CHLAMYDIA, DNA PROBE: NEGATIVE
Neisseria Gonorrhea: NEGATIVE

## 2017-02-05 LAB — URINE CULTURE: Culture: >=100000 — AB

## 2017-02-06 ENCOUNTER — Telehealth: Payer: Self-pay

## 2017-02-06 NOTE — Telephone Encounter (Signed)
Post ED Visit - Positive Culture Follow-up  Culture report reviewed by antimicrobial stewardship pharmacist:  []  Enzo BiNathan Batchelder, Pharm.D. []  Celedonio MiyamotoJeremy Frens, Pharm.D., BCPS AQ-ID []  Garvin FilaMike Maccia, Pharm.D., BCPS []  Georgina PillionElizabeth Martin, [address]Ph[address] BCPS []  LickingMinh Pham, [address]Ph[address]D., BCPS, AAHIVP []  Estella HuskMichelle Turner, Pharm.D., BCPS, AAHIVP []  Lysle Pearlachel Rumbarger, PharmD, BCPS []  Casilda Carlsaylor Stone, PharmD, BCPS []  Pollyann SamplesAndy Johnston, PharmD, BCPS Sharin MonsEmily Sinclair Pharm D Positive urine culture Treated with Cephalexin, organism sensitive to the same and no further patient follow-up is required at this time.  Jerry CarasCullom, Meliss Fleek Burnett 02/06/2017, 9:47 AM

## 2018-08-19 ENCOUNTER — Ambulatory Visit (HOSPITAL_COMMUNITY)
Admission: EM | Admit: 2018-08-19 | Discharge: 2018-08-19 | Disposition: A | Discharge status: Home / Self Care | Payer: Medicaid Other | Attending: Emergency Medicine | Admitting: Emergency Medicine

## 2018-08-19 ENCOUNTER — Encounter (HOSPITAL_COMMUNITY): Payer: Self-pay

## 2018-08-19 DIAGNOSIS — R55 Syncope and collapse: Secondary | ICD-10-CM | POA: Diagnosis not present

## 2018-08-19 DIAGNOSIS — J019 Acute sinusitis, unspecified: Secondary | ICD-10-CM

## 2018-08-19 LAB — POCT URINALYSIS DIP (DEVICE)
Glucose, UA: NEGATIVE mg/dL
Nitrite: NEGATIVE
Protein, ur: 100 mg/dL — AB
Specific Gravity, Urine: >=1.03 (ref 1.005–1.030)
Urobilinogen, UA: 1 mg/dL (ref 0.0–1.0)
pH: 6 (ref 5.0–8.0)

## 2018-08-19 MED ORDER — CETIRIZINE HCL 10 MG PO CAPS: 10.0000 mg | ORAL_CAPSULE | Freq: Every day | ORAL | 0 refills | Status: DC

## 2018-08-19 MED ORDER — IBUPROFEN 600 MG PO TABS: 600.0000 mg | ORAL_TABLET | Freq: Four times a day (QID) | ORAL | 0 refills | Status: DC | PRN

## 2018-08-19 MED ORDER — AMOXICILLIN-POT CLAVULANATE 875-125 MG PO TABS: 1.0000 | ORAL_TABLET | Freq: Two times a day (BID) | ORAL | 0 refills | Status: DC

## 2018-08-19 MED ORDER — AMOXICILLIN-POT CLAVULANATE 875-125 MG PO TABS: 1.0000 | ORAL_TABLET | Freq: Two times a day (BID) | ORAL | 0 refills | Status: AC

## 2018-08-19 MED ORDER — PSEUDOEPH-BROMPHEN-DM 30-2-10 MG/5ML PO SYRP: 5.0000 mL | ORAL_SOLUTION | Freq: Four times a day (QID) | ORAL | 0 refills | Status: DC | PRN

## 2018-08-19 NOTE — ED Triage Notes (Signed)
Pt present that this morning she blow her nose and passed out.  After she passed out she has been dizzy all day with a headache.

## 2018-08-19 NOTE — Discharge Instructions (Signed)
Please read attached information about passing out. I believe you passed out from straining too hard and being slightly dehydrated and under the weather.   Begin Augmentin twice daily for 1 week Cough syrup every 8 hours as needed Daily cetirizine to help with congestion Use anti-inflammatories for headache/pain/swelling. You may take up to 800 mg Ibuprofen every 8 hours with food. You may supplement Ibuprofen with Tylenol [id number] mg every 8 hours.   Focus on drinking plenty of fluids- may consider getting gatorade or pedialyte- begin with bland foods and transition to normal diet as you regain your appetite  If you have another episode of passing out, worsening headache, worst headache of life, worsening dizziness, changes in vision, go to emergency room

## 2018-08-20 NOTE — ED Provider Notes (Signed)
[facility]    CSN: [id number] Arrival date & time: 08/19/18  1819     History   Chief Complaint Chief Complaint  Patient presents with  . Dizziness  . Migraine    HPI Jackie Sweeney is a 23 y.o. female no significant past medical history presenting today for evaluation of an episode of syncope and dizziness.  Patient states that she has been sick with nasal congestion and cough for approximately 2 weeks.  This morning when she woke up she blew her nose, after which she felt her vision get dark and felt slightly diaphoretic.  This led to her passing out.  This was unwitnessed and patient is unsure how long she was out for.  Does believe that she fell to the ground.  Since she has had some slight dizziness and an off-and-on headache throughout the day.  Headache has been mild and has not progressed or worsened.  Denies any associated vision changes.  Denies passing out again.  She does admit to having decreased oral intake since being sick as she has had minimal appetite.  She denies any chest pain or shortness of breath.  She denies any weakness or difficulty speaking.  The only other time in her life when she has passed out was when she was pregnant and had been vomiting a lot.  She has been trying to take Mucinex and combo cold and flu medicine without relief of her symptoms.  HPI  History reviewed. No pertinent past medical history.  There are no active problems to display for this patient.   History reviewed. No pertinent surgical history.  OB History    Gravida  1   Para      Term      Preterm      AB      Living        SAB      TAB      Ectopic      Multiple      Live Births               Home Medications    Prior to Admission medications   Medication Sig Start Date End Date Taking? Authorizing Provider  amoxicillin-clavulanate (AUGMENTIN) 875-125 MG tablet Take 1 tablet by mouth every 12 (twelve) hours for 7 days. 08/19/18 08/26/18   ,  C, PA-C  brompheniramine-pseudoephedrine-DM 30-2-10 MG/5ML syrup Take 5 mLs by mouth 4 (four) times daily as needed. 08/19/18   ,  C, PA-C  Cetirizine HCl 10 MG CAPS Take 1 capsule (10 mg total) by mouth daily for 10 days. 08/19/18 08/29/18  ,  C, PA-C  ibuprofen (ADVIL,MOTRIN) 600 MG tablet Take 1 tablet (600 mg total) by mouth every 6 (six) hours as needed. 08/19/18   , Junius Creamer C, PA-C    Family History History reviewed. No pertinent family history.  Social History Social History   Tobacco Use  . Smoking status: Never Smoker  . Smokeless tobacco: Never Used  Substance Use Topics  . Alcohol use: No  . Drug use: No     Allergies   Patient has no known allergies.   Review of Systems Review of Systems  Constitutional: Positive for appetite change and fatigue. Negative for activity change, chills and fever.  HENT: Positive for congestion, rhinorrhea and sinus pressure. Negative for ear pain, sore throat and trouble swallowing.   Eyes: Negative for photophobia, pain, discharge, redness and visual disturbance.  Respiratory: Positive for cough. Negative for  chest tightness and shortness of breath.   Cardiovascular: Negative for chest pain.  Gastrointestinal: Negative for abdominal pain, diarrhea, nausea and vomiting.  Genitourinary: Negative for decreased urine volume and hematuria.  Musculoskeletal: Negative for myalgias, neck pain and neck stiffness.  Skin: Negative for rash.  Neurological: Positive for dizziness, syncope, light-headedness and headaches. Negative for facial asymmetry, speech difficulty, weakness and numbness.     Physical Exam Triage Vital Signs ED Triage Vitals  Enc Vitals Group     BP 08/19/18 1912 125/75     Pulse Rate 08/19/18 1912 85     Resp 08/19/18 1912 16     Temp 08/19/18 1912 98.3 F (36.8 C)     Temp Source 08/19/18 1912 Oral     SpO2 08/19/18 1912 100 %     Weight --      Height --      Head  Circumference --      Peak Flow --      Pain Score 08/19/18 1913 4     Pain Loc --      Pain Edu? --      Excl. in GC? --    Orthostatic VS for the past 24 hrs:  BP- Lying Pulse- Lying BP- Sitting Pulse- Sitting BP- Standing at 0 minutes Pulse- Standing at 0 minutes  08/19/18 1945 113/67 84 125/73 85 126/71 91    Updated Vital Signs BP 125/75 (BP Location: Right Arm)   Pulse 85   Temp 98.3 F (36.8 C) (Oral)   Resp 16   SpO2 100%   Breastfeeding Unknown   Visual Acuity Right Eye Distance:   Left Eye Distance:   Bilateral Distance:    Right Eye Near:   Left Eye Near:    Bilateral Near:     Physical Exam Vitals signs and nursing note reviewed.  Constitutional:      General: She is not in acute distress.    Appearance: She is well-developed.     Comments: Nontoxic-appearing, no acute distress  HENT:     Head: Normocephalic and atraumatic.     Ears:     Comments: Bilateral ears without tenderness to palpation of external auricle, tragus and mastoid, EAC's without erythema or swelling, TM's with good bony landmarks and cone of light. Non erythematous.     Nose:     Comments: Nasal mucosa slightly erythematous    Mouth/Throat:     Comments: Oral mucosa pink and moist, no tonsillar enlargement or exudate. Posterior pharynx patent and nonerythematous, no uvula deviation or swelling. Normal phonation. Eyes:     Extraocular Movements: Extraocular movements intact.     Conjunctiva/sclera: Conjunctivae normal.     Pupils: Pupils are equal, round, and reactive to light.  Neck:     Musculoskeletal: Neck supple.  Cardiovascular:     Rate and Rhythm: Normal rate and regular rhythm.     Heart sounds: No murmur.  Pulmonary:     Effort: Pulmonary effort is normal. No respiratory distress.     Breath sounds: Normal breath sounds.     Comments: Breathing comfortably at rest, CTABL, no wheezing, rales or other adventitious sounds auscultated Abdominal:     Palpations: Abdomen is  soft.     Tenderness: There is no abdominal tenderness.  Skin:    General: Skin is warm and dry.  Neurological:     General: No focal deficit present.     Mental Status: She is alert and oriented to person, place, and time.  Mental status is at baseline.     Comments: Patient A&O x3, cranial nerves II-XII grossly intact, strength at shoulders, hips and knees 5/5, equal bilaterally, patellar reflex 2+ bilaterallyNegative Romberg and Pronator Drift. Gait without abnormality.       UC Treatments / Results  Labs (all labs ordered are listed, but only abnormal results are displayed) Labs Reviewed  POCT URINALYSIS DIP (DEVICE) - Abnormal; Notable for the following components:      Result Value   Bilirubin Urine SMALL (*)    Ketones, ur TRACE (*)    Hgb urine dipstick TRACE (*)    Protein, ur 100 (*)    Leukocytes, UA TRACE (*)    All other components within normal limits    EKG None  Radiology No results found.  Procedures Procedures (including critical care time)  Medications Ordered in UC Medications - No data to display  Initial Impression / Assessment and Plan / UC Course  I have reviewed the triage vital signs and the nursing notes.  Pertinent labs & imaging results that were available during my care of the patient were reviewed by me and considered in my medical decision making (see chart for details).     Patient likely with episode of vasovagal syncope given straining with blowing nose.  No neuro deficits in clinic today.  Also likely secondary to recently being see second decreased oral intake.  UA suggestive of some mild dehydration, EKG normal sinus rhythm, no arrhythmia or signs of ischemia or infarction.  Negative orthostatics.  Headache is mild, no red flags regarding headache concerning for underlying hemorrhage related to fall.  Will treat patient for sinusitis given length of URI symptoms and general recommendations for rehydrating.  Advised that she if she has  another episode of passing out to go to the emergency room for further evaluation or if she has worsening headache, vision changes.Discussed strict return precautions. Patient verbalized understanding and is agreeable with plan.  Final Clinical Impressions(s) / UC Diagnoses   Final diagnoses:  Vasovagal syncope  Acute sinusitis with symptoms > 10 days     Discharge Instructions     Please read attached information about passing out. I believe you passed out from straining too hard and being slightly dehydrated and under the weather.   Begin Augmentin twice daily for 1 week Cough syrup every 8 hours as needed Daily cetirizine to help with congestion Use anti-inflammatories for headache/pain/swelling. You may take up to 800 mg Ibuprofen every 8 hours with food. You may supplement Ibuprofen with Tylenol [phone] mg every 8 hours.   Focus on drinking plenty of fluids- may consider getting gatorade or pedialyte- begin with bland foods and transition to normal diet as you regain your appetite  If you have another episode of passing out, worsening headache, worst headache of life, worsening dizziness, changes in vision, go to emergency room    ED Prescriptions    Medication Sig Dispense Auth. Provider   amoxicillin-clavulanate (AUGMENTIN) 875-125 MG tablet  (Status: Discontinued) Take 1 tablet by mouth every 12 (twelve) hours for 7 days. 14 tablet ,  C, PA-C   brompheniramine-pseudoephedrine-DM 30-2-10 MG/5ML syrup  (Status: Discontinued) Take 5 mLs by mouth 4 (four) times daily as needed. 120 mL ,  C, PA-C   Cetirizine HCl 10 MG CAPS  (Status: Discontinued) Take 1 capsule (10 mg total) by mouth daily for 10 days. 10 capsule ,  C, PA-C   ibuprofen (ADVIL,MOTRIN) 600 MG tablet  (Status: Discontinued) Take  1 tablet (600 mg total) by mouth every 6 (six) hours as needed. 30 tablet ,  C, PA-C   amoxicillin-clavulanate (AUGMENTIN) 875-125 MG  tablet Take 1 tablet by mouth every 12 (twelve) hours for 7 days. 14 tablet ,  C, PA-C   brompheniramine-pseudoephedrine-DM 30-2-10 MG/5ML syrup Take 5 mLs by mouth 4 (four) times daily as needed. 120 mL ,  C, PA-C   Cetirizine HCl 10 MG CAPS Take 1 capsule (10 mg total) by mouth daily for 10 days. 10 capsule ,  C, PA-C   ibuprofen (ADVIL,MOTRIN) 600 MG tablet Take 1 tablet (600 mg total) by mouth every 6 (six) hours as needed. 30 tablet , [place] C, PA-C     Controlled Substance Prescriptions [place] Controlled Substance Registry consulted? Not Applicable   Lew Dawes, [place] 08/20/18 [phone]

## 2020-03-12 ENCOUNTER — Encounter (HOSPITAL_COMMUNITY): Payer: Self-pay | Admitting: Emergency Medicine

## 2020-03-12 ENCOUNTER — Telehealth (HOSPITAL_COMMUNITY): Payer: Self-pay | Admitting: Urgent Care

## 2020-03-12 ENCOUNTER — Ambulatory Visit (HOSPITAL_COMMUNITY)
Admission: EM | Admit: 2020-03-12 | Discharge: 2020-03-12 | Disposition: A | Discharge status: Home / Self Care | Payer: Medicaid Other | Attending: Urgent Care | Admitting: Urgent Care

## 2020-03-12 ENCOUNTER — Other Ambulatory Visit: Payer: Self-pay

## 2020-03-12 DIAGNOSIS — M5412 Radiculopathy, cervical region: Secondary | ICD-10-CM | POA: Diagnosis not present

## 2020-03-12 DIAGNOSIS — M542 Cervicalgia: Secondary | ICD-10-CM

## 2020-03-12 MED ORDER — TIZANIDINE HCL 4 MG PO TABS: 4.0000 mg | ORAL_TABLET | Freq: Three times a day (TID) | ORAL | 0 refills | Status: DC | PRN

## 2020-03-12 MED ORDER — PREDNISONE 20 MG PO TABS: ORAL_TABLET | ORAL | 0 refills | Status: DC

## 2020-03-12 NOTE — Telephone Encounter (Signed)
RX resent to pharmacy.

## 2020-03-12 NOTE — ED Triage Notes (Signed)
Pt c/o neck pain that radiates down the left arm and into the finger tips. She states she has some numbness in the finger tips. She denies any known injury. Pt has pain with ROM.

## 2020-03-12 NOTE — ED Provider Notes (Signed)
[facility]   MRN: [id number] DOB: 09-16-1995  Subjective:   Jackie Sweeney is a 24 y.o. female presenting for several day history of persistent neck pain that radiates down to her left trapezius and shoots down into her fingertips like shocks.  Patient states that at times she ends up feeling numbness in her fingertips.  Denies falls, trauma, decreased range of motion.  However it does hurt to move as well.  She has had sciatica in the past and use prednisone with good relief.  Has not had to see an orthopedist, neurosurgeon.  Does not do a lot of heavy lifting at her work.  Is primarily abdomen.  She does have 3 children but does not have to carry them as they are not toddlers.  She does occasionally lift them.  No current facility-administered medications for this encounter.  Current Outpatient Medications:  .  Cetirizine HCl 10 MG CAPS, Take 1 capsule (10 mg total) by mouth daily for 10 days., Disp: 10 capsule, Rfl: 0   No Known Allergies  History reviewed. No pertinent past medical history.   History reviewed. No pertinent surgical history.  Family History  Problem Relation Age of Onset  . Healthy Mother   . Healthy Father     Social History   Tobacco Use  . Smoking status: Never Smoker  . Smokeless tobacco: Never Used  Vaping Use  . Vaping Use: Never used  Substance Use Topics  . Alcohol use: No  . Drug use: No    ROS   Objective:   Vitals: BP 122/85 (BP Location: Left Arm)   Pulse 97   Temp 98.6 F (37 C) (Oral)   Resp 16   LMP 03/11/2020   SpO2 98%   Breastfeeding No   Physical Exam Constitutional:      General: She is not in acute distress.    Appearance: Normal appearance. She is well-developed. She is not ill-appearing, toxic-appearing or diaphoretic.  HENT:     Head: Normocephalic and atraumatic.     Nose: Nose normal.     Mouth/Throat:     Mouth: Mucous membranes are moist.     Pharynx: Oropharynx is clear.  Eyes:     General:  No scleral icterus.       Right eye: No discharge.        Left eye: No discharge.     Extraocular Movements: Extraocular movements intact.     Conjunctiva/sclera: Conjunctivae normal.     Pupils: Pupils are equal, round, and reactive to light.  Cardiovascular:     Rate and Rhythm: Normal rate.  Pulmonary:     Effort: Pulmonary effort is normal.  Musculoskeletal:     Cervical back: Spasms (Along cervical paraspinal muscles, left trapezius) and tenderness (Along cervical paraspinal muscles and left trapezius) present. No swelling, edema, deformity, erythema, signs of trauma, lacerations, rigidity, torticollis, bony tenderness or crepitus. Pain with movement present. Normal range of motion.       Back:  Skin:    General: Skin is warm and dry.  Neurological:     General: No focal deficit present.     Mental Status: She is alert and oriented to person, place, and time.     Motor: No weakness.     Coordination: Coordination normal.     Gait: Gait normal.     Deep Tendon Reflexes: Reflexes normal.  Psychiatric:        Mood and Affect: Mood normal.  Behavior: Behavior normal.        Thought Content: Thought content normal.        Judgment: Judgment normal.     Assessment and Plan :   PDMP not reviewed this encounter.  1. Cervical radiculopathy   2. Neck pain     Patient requested aggressive management and therefore will use prednisone course with muscle relaxant.  Follow-up with neurosurgery if symptoms persist. Counseled patient on potential for adverse effects with medications prescribed/recommended today, ER and return-to-clinic precautions discussed, patient verbalized understanding.    Wallis Bamberg, [place] 03/12/20 1853

## 2021-05-14 ENCOUNTER — Ambulatory Visit (HOSPITAL_COMMUNITY)
Admission: EM | Admit: 2021-05-14 | Discharge: 2021-05-14 | Disposition: A | Discharge status: Home / Self Care | Payer: Medicaid Other | Attending: Family Medicine | Admitting: Family Medicine

## 2021-05-14 ENCOUNTER — Encounter (HOSPITAL_COMMUNITY): Payer: Self-pay | Admitting: Emergency Medicine

## 2021-05-14 ENCOUNTER — Other Ambulatory Visit: Payer: Self-pay

## 2021-05-14 DIAGNOSIS — R079 Chest pain, unspecified: Secondary | ICD-10-CM | POA: Diagnosis not present

## 2021-05-14 DIAGNOSIS — F418 Other specified anxiety disorders: Secondary | ICD-10-CM

## 2021-05-14 HISTORY — DX: Type 2 diabetes mellitus without complications: E11.9

## 2021-05-14 HISTORY — DX: Anxiety disorder, unspecified: F41.9

## 2021-05-14 NOTE — ED Provider Notes (Signed)
  [facility]   [id number] 05/14/21 Arrival Time: 1104  ASSESSMENT & PLAN:  1. Chest pain, unspecified type   2. Situational anxiety    Has f/u with PCP next week. To discuss anxiety. ECG with NSR here. No concerning findings. Reassured. No change in medical tx. Work note provided.  Reviewed expectations re: course of current medical issues. Questions answered. Outlined signs and symptoms indicating need for more acute intervention. Patient verbalized understanding. After Visit Summary given.   SUBJECTIVE:  Natale Barba is a 25 y.o. female who presents with feelings of anxiety/panic attacks. Situational. Worse in recent past. With assoc CP several times; new symptom for her. Takes Vistaril for panic attacks but makes her very sleepy; avoids taking at times. BP was elevated at work yesterday and this stress her. No current CP or anxiety.  Social History   Tobacco Use  Smoking Status Never  Smokeless Tobacco Never   Social History   Substance and Sexual Activity  Alcohol Use No   No recreational drug use reported.   OBJECTIVE:  Vitals:   05/14/21 1200  BP: 123/81  Pulse: 82  Resp: 18  Temp: 98.4 F (36.9 C)  TempSrc: Oral  SpO2: 100%    General appearance: alert; NAD Eyes: PERRLA; EOMI; conjunctiva normal HENT: normocephalic; atraumatic Neck: supple Lungs:unlabored Heart: regular Abdomen: soft, non-tender  Extremities: no edema; symmetrical with no gross deformities Skin: warm and dry Neurologic: normal gait; normal symmetric reflexes Psychological: alert and cooperative; appropriate mood; normal affect   No Known Allergies  Past Medical History:  Diagnosis Date   Anxiety    Diabetes mellitus without complication (HCC)    Social History   Socioeconomic History   Marital status: Married    Spouse name: Not on file   Number of children: Not on file   Years of education: Not on file   Highest education level: Not on file   Occupational History   Not on file  Tobacco Use   Smoking status: Never   Smokeless tobacco: Never  Vaping Use   Vaping Use: Never used  Substance and Sexual Activity   Alcohol use: No   Drug use: No   Sexual activity: Not on file  Other Topics Concern   Not on file  Social History Narrative   ** Merged History Encounter **       Social Determinants of Health   Financial Resource Strain: Not on file  Food Insecurity: Not on file  Transportation Needs: Not on file  Physical Activity: Not on file  Stress: Not on file  Social Connections: Not on file  Intimate Partner Violence: Not on file   Family History  Problem Relation Age of Onset   Healthy Mother    Healthy Father    History reviewed. No pertinent surgical history.     Mardella Layman, MD 05/14/21 [phone]

## 2021-05-14 NOTE — Discharge Instructions (Signed)
You have been seen at the [facility] today for chest pain. Your evaluation today was not suggestive of any emergent condition requiring medical intervention at this time. Your ECG (heart tracing) did not show any worrisome changes. However, some medical problems make take more time to appear. Therefore, it's very important that you pay attention to any new symptoms or worsening of your current condition.  Please proceed directly to the Emergency Department immediately should you feel worse in any way or have any of the following symptoms: increasing or different chest pain, pain that spreads to your arm, neck, jaw, back or abdomen, shortness of breath, or nausea and vomiting.  

## 2021-05-14 NOTE — ED Triage Notes (Signed)
Patient reports concern she is having panic attacks since last week.  Is taking hydroxyzine.  This medicine makes her sleepy.  Yesterday, she did not take it because it  causes such sleepiness.  Yesterday she had an episode of feeling like heart pounding.  Yesterday it seemed to be unending.  Patient reports blood pressure elevated yesterday.   Patient took hydroxyzine last night.  Today she is sleepy, doesn't feel heart pounding , reports heaviness in chest

## 2021-05-20 ENCOUNTER — Other Ambulatory Visit: Payer: Self-pay

## 2021-05-20 ENCOUNTER — Ambulatory Visit (HOSPITAL_COMMUNITY)
Admission: EM | Admit: 2021-05-20 | Discharge: 2021-05-20 | Disposition: A | Discharge status: Home / Self Care | Payer: Medicaid Other | Attending: Emergency Medicine | Admitting: Emergency Medicine

## 2021-05-20 ENCOUNTER — Ambulatory Visit (INDEPENDENT_AMBULATORY_CARE_PROVIDER_SITE_OTHER): Payer: Medicaid Other

## 2021-05-20 ENCOUNTER — Encounter (HOSPITAL_COMMUNITY): Payer: Self-pay | Admitting: Emergency Medicine

## 2021-05-20 DIAGNOSIS — M79642 Pain in left hand: Secondary | ICD-10-CM

## 2021-05-20 DIAGNOSIS — S6991XA Unspecified injury of right wrist, hand and finger(s), initial encounter: Secondary | ICD-10-CM

## 2021-05-20 DIAGNOSIS — M79641 Pain in right hand: Secondary | ICD-10-CM

## 2021-05-20 DIAGNOSIS — S60221A Contusion of right hand, initial encounter: Secondary | ICD-10-CM | POA: Diagnosis not present
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
## 2021-05-20 NOTE — Discharge Instructions (Addendum)
X ray is negative  Take motrin as needed for pain If pain continues after 1 week call ortho Can use heat and ice for pain
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
## 2022-02-06 ENCOUNTER — Encounter (HOSPITAL_COMMUNITY): Payer: Self-pay | Admitting: Obstetrics and Gynecology

## 2022-02-06 ENCOUNTER — Inpatient Hospital Stay (HOSPITAL_COMMUNITY)
Admission: AD | Admit: 2022-02-06 | Discharge: 2022-02-06 | Disposition: A | Discharge status: Home / Self Care | Payer: Medicaid Other | Attending: Obstetrics and Gynecology | Admitting: Obstetrics and Gynecology

## 2022-02-06 ENCOUNTER — Other Ambulatory Visit: Payer: Self-pay

## 2022-02-06 DIAGNOSIS — O2341 Unspecified infection of urinary tract in pregnancy, first trimester: Secondary | ICD-10-CM | POA: Diagnosis not present

## 2022-02-06 DIAGNOSIS — O24111 Pre-existing diabetes mellitus, type 2, in pregnancy, first trimester: Secondary | ICD-10-CM | POA: Insufficient documentation

## 2022-02-06 DIAGNOSIS — O219 Vomiting of pregnancy, unspecified: Secondary | ICD-10-CM | POA: Diagnosis not present

## 2022-02-06 DIAGNOSIS — O26891 Other specified pregnancy related conditions, first trimester: Secondary | ICD-10-CM | POA: Diagnosis not present

## 2022-02-06 DIAGNOSIS — R638 Other symptoms and signs concerning food and fluid intake: Secondary | ICD-10-CM

## 2022-02-06 DIAGNOSIS — Z3A01 Less than 8 weeks gestation of pregnancy: Secondary | ICD-10-CM | POA: Insufficient documentation

## 2022-02-06 LAB — URINALYSIS, ROUTINE W REFLEX MICROSCOPIC
Bilirubin Urine: NEGATIVE
Glucose, UA: NEGATIVE mg/dL
Hgb urine dipstick: NEGATIVE
Ketones, ur: NEGATIVE mg/dL
Nitrite: POSITIVE — AB
Protein, ur: NEGATIVE mg/dL
Specific Gravity, Urine: 1.027 (ref 1.005–1.030)
pH: 5 (ref 5.0–8.0)

## 2022-02-06 LAB — BASIC METABOLIC PANEL
Anion gap: 10 (ref 5–15)
BUN: 10 mg/dL (ref 6–20)
CO2: 23 mmol/L (ref 22–32)
Calcium: 9.6 mg/dL (ref 8.9–10.3)
Chloride: 104 mmol/L (ref 98–111)
Creatinine, Ser: 0.69 mg/dL (ref 0.44–1.00)
GFR, Estimated: >60 mL/min (ref >60)
Glucose, Bld: 96 mg/dL (ref 70–99)
Potassium: 4 mmol/L (ref 3.5–5.1)
Sodium: 137 mmol/L (ref 135–145)

## 2022-02-06 LAB — CBC
HCT: 40.1 % (ref 36.0–46.0)
Hemoglobin: 12.8 g/dL (ref 12.0–15.0)
MCH: 26.6 pg (ref 26.0–34.0)
MCHC: 31.9 g/dL (ref 30.0–36.0)
MCV: 83.4 fL (ref 80.0–100.0)
Platelets: 277 10*3/uL (ref 150–400)
RBC: 4.81 MIL/uL (ref 3.87–5.11)
RDW: 14.6 % (ref 11.5–15.5)
WBC: 7.9 10*3/uL (ref 4.0–10.5)
nRBC: 0 % (ref 0.0–0.2)

## 2022-02-06 LAB — GLUCOSE, CAPILLARY: Glucose-Capillary: 95 mg/dL (ref 70–99)

## 2022-02-06 NOTE — MAU Note (Signed)
Jackie Sweeney is a 26 y.o. at [redacted]w[redacted]d here in MAU reporting: when she lays on her left side she feels like her heart is racing and then when she sits up she feels SOB. Went to her office this AM and states they told her to come in because they think she may be dehydrated. States 3 episodes of emesis this AM. Was Rx Diclegis but just picked that up today.  Onset of complaint: ongoing  Pain score: 0/10  Vitals:   02/06/22 1247  BP: 120/60  Pulse: 75  Resp: 15  Temp: 97.7 F (36.5 C)  SpO2: 100%     Lab orders placed from triage: UA

## 2022-02-06 NOTE — Discharge Instructions (Signed)

## 2022-02-06 NOTE — MAU Provider Note (Signed)
History     CSN: [id number]  Arrival date and time: 02/06/22 1235   Event Date/Time   First Provider Initiated Contact with Patient 02/06/22 1426      Chief Complaint  Patient presents with   Nausea   HPI Jackie Sweeney is a 26 y.o. [id number] at 39w6dh who presents to MAU with chief complaint of nausea and vomiting. This is a new problem, onset  Patient reports feeling irregular palpitations when she moves into left lateral position. She denies chest pain, weakness or activity intolerance.  Patient also reports feeling short of breath when she sits up. She continues to ambulate independently. She is able to speak in full sentences without pausing to catch her breath. She denies cough, sicks contacts, fever or recent illness.  Patient was prescribed Diclegis and Macrobid for her complaints this morning. She has the prescriptions in her purse and plans to start taking them later today. She was advised by her PCP in the [facility] system to present to MAU for evaluation for dehydration.  Patient PMH is significant for T2DM, managed on Metformin.  OB History     Gravida  4   Para  2   Term  2   Preterm      AB  1   Living  2      SAB  1   IAB      Ectopic      Multiple      Live Births  2           Past Medical History:  Diagnosis Date   Anxiety    Diabetes mellitus without complication (HCC)     History reviewed. No pertinent surgical history.  Family History  Problem Relation Age of Onset   Healthy Mother    Healthy Father     Social History   Tobacco Use   Smoking status: Never   Smokeless tobacco: Never  Vaping Use   Vaping Use: Never used  Substance Use Topics   Alcohol use: No   Drug use: No    Allergies: No Known Allergies  Medications Prior to Admission  Medication Sig Dispense Refill Last Dose   metFORMIN (GLUCOPHAGE-XR) 500 MG 24 hr tablet Take 500 mg by mouth daily.   02/06/2022   prenatal vitamin w/FE, FA (PRENATAL 1 + 1) 27-1 MG  TABS tablet Take 1 tablet by mouth daily at 12 noon.   02/06/2022   buPROPion (WELLBUTRIN SR) 150 MG 12 hr tablet       Cetirizine HCl 10 MG CAPS Take 1 capsule (10 mg total) by mouth daily for 10 days. (Patient not taking: Reported on 05/14/2021) 10 capsule 0    hydrOXYzine (VISTARIL) 25 MG capsule Take 25 mg by mouth daily as needed.      predniSONE (DELTASONE) 20 MG tablet Day 1-3: Take 3 tablets daily. Day 4-6: Take 2 tablets daily. Day 7-9: Take 1 tablet daily. Take tablets daily with breakfast. (Patient not taking: Reported on 05/14/2021) 18 tablet 0    tiZANidine (ZANAFLEX) 4 MG tablet Take 1 tablet (4 mg total) by mouth every 8 (eight) hours as needed. (Patient not taking: Reported on 05/14/2021) 30 tablet 0     Review of Systems  Constitutional:  Positive for fatigue.  Respiratory:  Negative for chest tightness.   Cardiovascular:  Positive for palpitations. Negative for chest pain.  Gastrointestinal:  Positive for nausea.  All other systems reviewed and are negative.  Physical Exam   Blood pressure  124/66, pulse 69, temperature 97.7 F (36.5 C), temperature source Oral, resp. rate 15, height 5\' 3"  (1.6 m), weight 98 kg, last menstrual period 05/19/2021, SpO2 100 %.  Physical Exam Vitals and nursing note reviewed. Exam conducted with a chaperone present.  Constitutional:      General: She is not in acute distress.    Appearance: Normal appearance. She is not ill-appearing.  Cardiovascular:     Rate and Rhythm: Normal rate and regular rhythm.     Pulses: Normal pulses.     Heart sounds: Normal heart sounds.  Pulmonary:     Effort: Pulmonary effort is normal.     Breath sounds: Normal breath sounds.  Skin:    General: Skin is warm.     Capillary Refill: Capillary refill takes less than 2 seconds.  Neurological:     Mental Status: She is alert and oriented to person, place, and time.  Psychiatric:        Mood and Affect: Mood normal.        Behavior: Behavior normal.         Thought Content: Thought content normal.        Judgment: Judgment normal.   Patient ambulating independently, speaking in full sentences without difficulty, vitals stable throughout  MAU Course  Procedures  MDM  --Concern for dehydration voiced by patient. Reviewed moist and pink mucus membranes, appropriate skin turgor, absence of ketones on UA --Abnormal UA but urine culture already accomplished by outpatient team and patient hand carrying rx for that. Advised patient to initiate as previously directed --Normal Sinus Rhythm on ECG. Reviewed by Dr. 05/21/2021 (visible in ECG tab). No additional workup indicated  Orders Placed This Encounter  Procedures   Urinalysis, Routine w reflex microscopic Urine, Clean Catch   CBC   Basic metabolic panel   Glucose, capillary   ED EKG   Discharge patient   Patient Vitals for the past 24 hrs:  BP Temp Temp src Pulse Resp SpO2 Height Weight  02/06/22 1437 124/66 -- -- 69 -- -- -- --  02/06/22 1247 120/60 97.7 F (36.5 C) Oral 75 15 100 % -- --  02/06/22 1243 -- -- -- -- -- -- 5\' 3"  (1.6 m) 98 kg   Results for orders placed or performed during the hospital encounter of 02/06/22 (from the past 24 hour(s))  Urinalysis, Routine w reflex microscopic Urine, Clean Catch     Status: Abnormal   Collection Time: 02/06/22 12:59 PM  Result Value Ref Range   Color, Urine AMBER (A) YELLOW   APPearance CLOUDY (A) CLEAR   Specific Gravity, Urine 1.027 1.005 - 1.030   pH 5.0 5.0 - 8.0   Glucose, UA NEGATIVE NEGATIVE mg/dL   Hgb urine dipstick NEGATIVE NEGATIVE   Bilirubin Urine NEGATIVE NEGATIVE   Ketones, ur NEGATIVE NEGATIVE mg/dL   Protein, ur NEGATIVE NEGATIVE mg/dL   Nitrite POSITIVE (A) NEGATIVE   Leukocytes,Ua MODERATE (A) NEGATIVE   RBC / HPF 0-5 0 - 5 RBC/hpf   WBC, UA 11-20 0 - 5 WBC/hpf   Bacteria, UA MANY (A) NONE SEEN   Squamous Epithelial / LPF 21-50 0 - 5   Mucus PRESENT   Glucose, capillary     Status: None   Collection Time: 02/06/22   1:34 PM  Result Value Ref Range   Glucose-Capillary 95 70 - 99 mg/dL  CBC     Status: None   Collection Time: 02/06/22  1:42 PM  Result Value Ref Range  WBC 7.9 4.0 - 10.5 K/uL   RBC 4.81 3.87 - 5.11 MIL/uL   Hemoglobin 12.8 12.0 - 15.0 g/dL   HCT 16.1 09.6 - 04.5 %   MCV 83.4 80.0 - 100.0 fL   MCH 26.6 26.0 - 34.0 pg   MCHC 31.9 30.0 - 36.0 g/dL   RDW 40.9 81.1 - 91.4 %   Platelets 277 150 - 400 K/uL   nRBC 0.0 0.0 - 0.2 %  Basic metabolic panel     Status: None   Collection Time: 02/06/22  1:42 PM  Result Value Ref Range   Sodium 137 135 - 145 mmol/L   Potassium 4.0 3.5 - 5.1 mmol/L   Chloride 104 98 - 111 mmol/L   CO2 23 22 - 32 mmol/L   Glucose, Bld 96 70 - 99 mg/dL   BUN 10 6 - 20 mg/dL   Creatinine, Ser 7.82 0.44 - 1.00 mg/dL   Calcium 9.6 8.9 - 95.6 mg/dL   GFR, Estimated >21 >30 mL/min   Anion gap 10 5 - 15   Assessment and Plan  --26 y.o. [id number] at [redacted]w[redacted]d  --IUP previously confirmed by outpatient team --No acute findings from MAU evaluation --Patient to initiate Macrobid and Diclegis as previously advised --Discharge home in stable condition  Calvert Cantor, MSA, MSN, CNM

## 2022-03-16 ENCOUNTER — Other Ambulatory Visit: Payer: Self-pay

## 2022-03-16 ENCOUNTER — Inpatient Hospital Stay (HOSPITAL_COMMUNITY)
Admission: AD | Admit: 2022-03-16 | Discharge: 2022-03-16 | Disposition: A | Discharge status: Home / Self Care | Payer: Medicaid Other | Attending: Family Medicine | Admitting: Family Medicine

## 2022-03-16 DIAGNOSIS — O26891 Other specified pregnancy related conditions, first trimester: Secondary | ICD-10-CM | POA: Insufficient documentation

## 2022-03-16 DIAGNOSIS — O219 Vomiting of pregnancy, unspecified: Secondary | ICD-10-CM | POA: Insufficient documentation

## 2022-03-16 DIAGNOSIS — H3509 Other intraretinal microvascular abnormalities: Secondary | ICD-10-CM | POA: Insufficient documentation

## 2022-03-16 DIAGNOSIS — Z3A12 12 weeks gestation of pregnancy: Secondary | ICD-10-CM | POA: Insufficient documentation

## 2022-03-16 DIAGNOSIS — R112 Nausea with vomiting, unspecified: Secondary | ICD-10-CM | POA: Insufficient documentation

## 2022-03-16 LAB — URINALYSIS, ROUTINE W REFLEX MICROSCOPIC
Bilirubin Urine: NEGATIVE
Glucose, UA: NEGATIVE mg/dL
Hgb urine dipstick: NEGATIVE
Ketones, ur: 80 mg/dL — AB
Nitrite: NEGATIVE
Protein, ur: 30 mg/dL — AB
Specific Gravity, Urine: 1.026 (ref 1.005–1.030)
pH: 5 (ref 5.0–8.0)

## 2022-03-16 MED ORDER — METOCLOPRAMIDE HCL 10 MG PO TABS: 10.0000 mg | ORAL_TABLET | Freq: Four times a day (QID) | ORAL | 0 refills | Status: AC

## 2022-03-16 MED ORDER — FAMOTIDINE 20 MG PO TABS: 20.0000 mg | ORAL_TABLET | Freq: Two times a day (BID) | ORAL | 0 refills | Status: AC

## 2022-03-16 NOTE — MAU Note (Signed)
Jackie Sweeney is a 26 y.o. at [redacted]w[redacted]d here in MAU reporting: she has N/V, taking Diclegis.  States she's not concerned with vomiting but worried about the yellow spots in her eyes.  States she has had yellow spots in her eyes since Saturday LMP: N/A Onset of complaint: Saturday night Pain score: 0 Vitals:   03/16/22 1827  BP: (!) 144/83  Pulse: 90  Resp: 18  Temp: 98.8 F (37.1 C)  SpO2: 100%     FHT:154 bpm Lab orders placed from triage:   UA
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
## 2022-09-01 ENCOUNTER — Encounter (HOSPITAL_COMMUNITY): Payer: Self-pay | Admitting: Obstetrics & Gynecology

## 2022-09-01 ENCOUNTER — Other Ambulatory Visit: Payer: Self-pay

## 2022-09-01 ENCOUNTER — Inpatient Hospital Stay (HOSPITAL_COMMUNITY)
Admission: AD | Admit: 2022-09-01 | Discharge: 2022-09-01 | Disposition: A | Discharge status: Home / Self Care | Payer: Medicaid Other | Attending: Obstetrics & Gynecology | Admitting: Obstetrics & Gynecology

## 2022-09-01 DIAGNOSIS — O4703 False labor before 37 completed weeks of gestation, third trimester: Secondary | ICD-10-CM | POA: Insufficient documentation

## 2022-09-01 DIAGNOSIS — O479 False labor, unspecified: Secondary | ICD-10-CM | POA: Diagnosis not present

## 2022-09-01 DIAGNOSIS — Z3A36 36 weeks gestation of pregnancy: Secondary | ICD-10-CM | POA: Diagnosis not present

## 2022-09-01 DIAGNOSIS — O26893 Other specified pregnancy related conditions, third trimester: Secondary | ICD-10-CM | POA: Diagnosis present

## 2022-09-01 NOTE — MAU Provider Note (Signed)
Event Date/Time   First Provider Initiated Contact with Patient 09/01/22 1646      S: Ms. Jackie Sweeney is a 27 y.o. [phone] at [redacted]w[redacted]d  who presents to MAU today complaining of lower abdominal cramping and irregular contractions since earlier this morning. She reports she did have some light spotting with wiping earlier this morning but none since.  She reports only have a little mucousy discharge. She denies any heavy vaginal bleeding or leaking fluid. She reports normal fetal movement.    Patient receives [facility] in [place]. Records reviewed. Pregnancy complicated by Q6PY. Has appointment scheduled tomorrow.   O: BP 119/81   Pulse (!) 110   Temp 98.2 F (36.8 C) (Oral)   Resp 16   Ht 5\' 3"  (1.6 m)   Wt 100.5 kg   LMP 05/19/2021   SpO2 98% Comment: room air  BMI 39.25 kg/m  GENERAL: Well-developed, well-nourished female in no acute distress.  HEAD: Normocephalic, atraumatic.  CHEST: Normal effort of breathing, regular heart rate ABDOMEN: Soft, nontender, gravid  Cervical exam:  Dilation: Closed Effacement (%): 50 Cervical Position: Posterior Exam by:: Maryagnes Amos, CNM  Fetal Monitoring: Baseline: 135 bpm Variability: moderate Accelerations: +15x15 Decelerations: absent Contractions: irregular  A: SIUP at [redacted]w[redacted]d  False labor  P: Discharge home in stable condition Labor precautions and fetal kick counts given Keep OB appointment as scheduled tomorrow Return to MAU as needed for new/worsening symptoms   Renee Harder, CNM 09/01/2022 5:01 PM

## 2022-09-01 NOTE — MAU Note (Signed)
Jackie Sweeney is a 27 y.o. at [redacted]w[redacted]d here in MAU reporting: starting spotting and seeing increased discharge since 0900. Discharge is creamy and mucus. Also feeling some cramping, pain has been pretty constant. No LOF. +FM.   Onset of complaint: today  Pain score: 2/10  Vitals:   09/01/22 1621  BP: 124/74  Pulse: (!) 113  Resp: 16  Temp: 98.2 F (36.8 C)  SpO2: 98%     FHT:152  Lab orders placed from triage: none
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
